# Patient Record
Sex: Male | Born: 1950 | Race: Black or African American | Hispanic: No | Marital: Single
Health system: Southern US, Community
[De-identification: ages and names within clinical notes are randomized; demographics above are authoritative.]

---

## 2007-12-06 ENCOUNTER — Emergency Department (HOSPITAL_COMMUNITY): Admission: EM | Admit: 2007-12-06 | Discharge: 2007-12-06 | Payer: Self-pay | Admitting: Emergency Medicine

## 2020-03-12 ENCOUNTER — Emergency Department (HOSPITAL_COMMUNITY)
Admission: EM | Admit: 2020-03-12 | Discharge: 2020-03-13 | Disposition: A | Discharge status: Home / Self Care | Payer: Medicare Other | Attending: Emergency Medicine | Admitting: Emergency Medicine

## 2020-03-12 ENCOUNTER — Emergency Department (HOSPITAL_COMMUNITY): Payer: Medicare Other

## 2020-03-12 DIAGNOSIS — Z20822 Contact with and (suspected) exposure to covid-19: Secondary | ICD-10-CM | POA: Diagnosis not present

## 2020-03-12 DIAGNOSIS — Z23 Encounter for immunization: Secondary | ICD-10-CM | POA: Insufficient documentation

## 2020-03-12 DIAGNOSIS — W3400XA Accidental discharge from unspecified firearms or gun, initial encounter: Secondary | ICD-10-CM | POA: Diagnosis not present

## 2020-03-12 DIAGNOSIS — Y92003 Bedroom of unspecified non-institutional (private) residence as the place of occurrence of the external cause: Secondary | ICD-10-CM | POA: Insufficient documentation

## 2020-03-12 DIAGNOSIS — S42351A Displaced comminuted fracture of shaft of humerus, right arm, initial encounter for closed fracture: Secondary | ICD-10-CM | POA: Diagnosis not present

## 2020-03-12 DIAGNOSIS — Y9389 Activity, other specified: Secondary | ICD-10-CM | POA: Diagnosis not present

## 2020-03-12 DIAGNOSIS — S4991XA Unspecified injury of right shoulder and upper arm, initial encounter: Secondary | ICD-10-CM | POA: Diagnosis present

## 2020-03-12 DIAGNOSIS — T508X5A Adverse effect of diagnostic agents, initial encounter: Secondary | ICD-10-CM | POA: Insufficient documentation

## 2020-03-12 DIAGNOSIS — T1490XA Injury, unspecified, initial encounter: Secondary | ICD-10-CM

## 2020-03-12 LAB — COMPREHENSIVE METABOLIC PANEL
ALT: 17 U/L (ref 0–44)
AST: 25 U/L (ref 15–41)
Albumin: 3.9 g/dL (ref 3.5–5.0)
Alkaline Phosphatase: 55 U/L (ref 38–126)
Anion gap: 14 (ref 5–15)
BUN: 13 mg/dL (ref 8–23)
CO2: 20 mmol/L — ABNORMAL LOW (ref 22–32)
Calcium: 8.9 mg/dL (ref 8.9–10.3)
Chloride: 104 mmol/L (ref 98–111)
Creatinine, Ser: 1.22 mg/dL (ref 0.61–1.24)
GFR, Estimated: >60 mL/min (ref >60)
Glucose, Bld: 107 mg/dL — ABNORMAL HIGH (ref 70–99)
Potassium: 3.4 mmol/L — ABNORMAL LOW (ref 3.5–5.1)
Sodium: 138 mmol/L (ref 135–145)
Total Bilirubin: 0.3 mg/dL (ref 0.3–1.2)
Total Protein: 7.3 g/dL (ref 6.5–8.1)

## 2020-03-12 LAB — I-STAT CHEM 8, ED
BUN: 16 mg/dL (ref 8–23)
Calcium, Ion: 1.05 mmol/L — ABNORMAL LOW (ref 1.15–1.40)
Chloride: 105 mmol/L (ref 98–111)
Creatinine, Ser: 1.2 mg/dL (ref 0.61–1.24)
Glucose, Bld: 107 mg/dL — ABNORMAL HIGH (ref 70–99)
HCT: 40 % (ref 39.0–52.0)
Hemoglobin: 13.6 g/dL (ref 13.0–17.0)
Potassium: 3.7 mmol/L (ref 3.5–5.1)
Sodium: 137 mmol/L (ref 135–145)
TCO2: 23 mmol/L (ref 22–32)

## 2020-03-12 LAB — PROTIME-INR
INR: 1 (ref 0.8–1.2)
Prothrombin Time: 13 seconds (ref 11.4–15.2)

## 2020-03-12 LAB — ETHANOL: Alcohol, Ethyl (B): 88 mg/dL — ABNORMAL HIGH (ref <10)

## 2020-03-12 MED ORDER — CEFAZOLIN SODIUM-DEXTROSE 2-4 GM/100ML-% IV SOLN
2.0000 g | INTRAVENOUS | Status: AC
  Administered 2020-03-13: 01:00:00 2 g via INTRAVENOUS
  Filled 2020-03-12: qty 100

## 2020-03-12 MED ORDER — IOHEXOL 350 MG/ML SOLN
100.0000 mL | Freq: Once | INTRAVENOUS | Status: AC | PRN
  Administered 2020-03-12: 100 mL via INTRAVENOUS

## 2020-03-12 MED ORDER — SODIUM CHLORIDE 0.9 % IV BOLUS
500.0000 mL | Freq: Once | INTRAVENOUS | Status: AC
  Administered 2020-03-12: 23:00:00 500 mL via INTRAVENOUS

## 2020-03-12 MED ORDER — MORPHINE SULFATE (PF) 4 MG/ML IV SOLN
4.0000 mg | INTRAVENOUS | Status: DC | PRN
  Administered 2020-03-13: 4 mg via INTRAVENOUS
  Filled 2020-03-12: qty 1

## 2020-03-12 MED ORDER — TETANUS-DIPHTH-ACELL PERTUSSIS 5-2.5-18.5 LF-MCG/0.5 IM SUSY
0.5000 mL | PREFILLED_SYRINGE | Freq: Once | INTRAMUSCULAR | Status: AC
  Administered 2020-03-13: 07:00:00 0.5 mL via INTRAMUSCULAR
  Filled 2020-03-12: qty 0.5

## 2020-03-12 MED ORDER — FENTANYL CITRATE (PF) 100 MCG/2ML IJ SOLN
50.0000 ug | Freq: Once | INTRAMUSCULAR | Status: AC
  Administered 2020-03-12: 23:00:00 50 ug via INTRAVENOUS
  Filled 2020-03-12: qty 2
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
## 2020-03-12 NOTE — ED Notes (Signed)
Patient transported to CT 

## 2020-03-13 ENCOUNTER — Encounter (HOSPITAL_COMMUNITY): Payer: Self-pay | Admitting: Emergency Medicine

## 2020-03-13 LAB — CBC
HCT: 42.4 % (ref 39.0–52.0)
Hemoglobin: 14.8 g/dL (ref 13.0–17.0)
MCH: 31.8 pg (ref 26.0–34.0)
MCHC: 34.9 g/dL (ref 30.0–36.0)
MCV: 91.2 fL (ref 80.0–100.0)
Platelets: 266 10*3/uL (ref 150–400)
RBC: 4.65 MIL/uL (ref 4.22–5.81)
RDW: 13.6 % (ref 11.5–15.5)
WBC: 9.9 10*3/uL (ref 4.0–10.5)
nRBC: 0 % (ref 0.0–0.2)

## 2020-03-13 LAB — RESP PANEL BY RT-PCR (FLU A&B, COVID) ARPGX2
Influenza A by PCR: NEGATIVE
Influenza B by PCR: NEGATIVE
SARS Coronavirus 2 by RT PCR: NEGATIVE

## 2020-03-13 MED ORDER — HYDROCODONE-ACETAMINOPHEN 5-325 MG PO TABS
1.0000 | ORAL_TABLET | Freq: Once | ORAL | Status: AC
  Administered 2020-03-13: 08:00:00 1 via ORAL
  Filled 2020-03-13: qty 1

## 2020-03-13 MED ORDER — IBUPROFEN 400 MG PO TABS: 400.0000 mg | ORAL_TABLET | Freq: Three times a day (TID) | ORAL | 0 refills | Status: AC | PRN

## 2020-03-13 MED ORDER — HYDROCODONE-ACETAMINOPHEN 5-325 MG PO TABS: 1.0000 | ORAL_TABLET | Freq: Four times a day (QID) | ORAL | 0 refills | Status: AC | PRN
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
## 2020-03-13 NOTE — ED Provider Notes (Signed)
Patient continues to improve.  His forearm and left bicep are more soft.  Distal pulses intact.  Appropriate handgrips is noted in left hand and he denies any paresthesias. Plan will be to continue to monitor for radiology to reevaluate patient and will likely discharge home. I discussed the need to follow-up with orthopedics next week and number has been provided.  I have also ordered pain medications for patient as an outpatient.  Per orthopedic instructions he will be given a sling   Zadie Rhine, MD 03/13/20 [phone]

## 2020-03-13 NOTE — ED Provider Notes (Signed)
Pt resting comfortably No new complaints Distal pulses intact on left UE Denies weakness/numbness in hand    Zadie Rhine, MD 03/13/20 [phone]

## 2020-03-13 NOTE — ED Notes (Signed)
Continuing to monitor arm, good pulses.

## 2020-03-13 NOTE — ED Provider Notes (Signed)
Discussed case with Dr. Aundria Rud from orthopedics.  He has reviewed the x-rays.  I discussed the wounds.  He recommends a sling and follow-up early next week.  One-time dose of antibiotics here but no home-going antibiotics.  Patient is neurovascular intact in both extremities.  He will need to be monitored for the IV contrast infiltration.   Zadie Rhine, MD 03/13/20 [phone]
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
## 2020-03-13 NOTE — ED Notes (Addendum)
Left arm continues to appear to be swelling and tight.  Has moved up his arm about 4 inches at this point.  CT verified w/ RN.  Provider made aware, bedside

## 2020-03-13 NOTE — ED Notes (Signed)
Removed ice from left arm.  Good pulses in left arm, continues to be elevated.    IV team bedside starting IV
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
## 2020-03-13 NOTE — ED Notes (Signed)
Reapplied ice to left arm.

## 2020-03-13 NOTE — Progress Notes (Signed)
   Evaluation after Contrast Extravasation  Patient seen and examined after contrast extravasation while in ED.  Exam: There is moderate swelling at the left upper arm.  There is no erythema. There is no discoloration. There are no blisters. There are no signs of decreased perfusion of the skin.  It is not warm to touch.  The patient has good ROM in fingers.  Radial pulse is normal.  Per contrast extravasation protocol, I have instructed the patient to keep an ice pack on the area for 20-60 minutes at a time for about 48 hours.   Keep arm elevated as much as possible.   The patient understands to call the radiology department if there is: - increase in pain or swelling - changed or altered sensation - ulceration or blistering - increasing redness - warmth or increasing firmness - decreased tissue perfusion as noted by decreased capillary refill or discoloration of skin - decreased pulses peripheral to site   [place] Griffey Nicasio PA-C 03/13/2020 7:37 AM

## 2020-03-13 NOTE — ED Provider Notes (Signed)
Patient reports he feels the same without any change.  Denies any numbness in the left hand.  He has appropriate motor strength in the hand.  Distal pulses are intact.   Zadie Rhine, MD 03/13/20 [phone]

## 2020-03-13 NOTE — Discharge Instructions (Addendum)
Be sure to follow-up with the bone specialist that is listed on Monday. You can  use a sling each day to help protect your right arm. Be sure to keep the left arm elevated and you can use ice packs on and  off  the swollen area for 48 hours If you have any worsening swelling left arm, numbness in your hand, or discoloration of the left hand please return to the ER immediately
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
